# Patient Record
Sex: Female | Born: 1970 | Race: Black or African American | Hispanic: No | Marital: Married | Smoking: Never smoker
Health system: Southern US, Community
[De-identification: ages and names within clinical notes are randomized; demographics above are authoritative.]

## PROBLEM LIST (undated history)

## (undated) DIAGNOSIS — D509 Iron deficiency anemia, unspecified: Secondary | ICD-10-CM

---

## 2013-01-30 ENCOUNTER — Observation Stay: Payer: Self-pay | Admitting: Internal Medicine

## 2013-01-30 LAB — DRUG SCREEN, URINE
Amphetamines, Ur Screen: NEGATIVE (ref ?–1000)
Barbiturates, Ur Screen: NEGATIVE (ref ?–200)
Benzodiazepine, Ur Scrn: NEGATIVE (ref ?–200)
Cannabinoid 50 Ng, Ur ~~LOC~~: NEGATIVE (ref ?–50)
Cocaine Metabolite,Ur ~~LOC~~: NEGATIVE (ref ?–300)
Methadone, Ur Screen: NEGATIVE (ref ?–300)
Tricyclic, Ur Screen: NEGATIVE (ref ?–1000)

## 2013-01-30 LAB — COMPREHENSIVE METABOLIC PANEL
Alkaline Phosphatase: 62 U/L (ref 50–136)
Anion Gap: 7 (ref 7–16)
BUN: 12 mg/dL (ref 7–18)
Bilirubin,Total: 0.2 mg/dL (ref 0.2–1.0)
Calcium, Total: 9 mg/dL (ref 8.5–10.1)
Creatinine: 0.72 mg/dL (ref 0.60–1.30)
EGFR (African American): >60
EGFR (Non-African Amer.): >60
Osmolality: 277 (ref 275–301)
Potassium: 3.7 mmol/L (ref 3.5–5.1)
SGOT(AST): 23 U/L (ref 15–37)
SGPT (ALT): 28 U/L (ref 12–78)
Sodium: 139 mmol/L (ref 136–145)

## 2013-01-30 LAB — URINALYSIS, COMPLETE
Glucose,UR: NEGATIVE mg/dL (ref 0–75)
Ph: 6 (ref 4.5–8.0)
RBC,UR: <1 /HPF (ref 0–5)

## 2013-01-30 LAB — FERRITIN: Ferritin (ARMC): 2 ng/mL — ABNORMAL LOW (ref 8–388)

## 2013-01-30 LAB — TSH: Thyroid Stimulating Horm: 0.689 u[IU]/mL

## 2013-01-30 LAB — CBC
HCT: 24.4 % — ABNORMAL LOW (ref 35.0–47.0)
HGB: 7.1 g/dL — ABNORMAL LOW (ref 12.0–16.0)
MCHC: 29 g/dL — ABNORMAL LOW (ref 32.0–36.0)
MCV: 67 fL — ABNORMAL LOW (ref 80–100)
RBC: 3.68 10*6/uL — ABNORMAL LOW (ref 3.80–5.20)
RDW: 23.9 % — ABNORMAL HIGH (ref 11.5–14.5)
WBC: 7.2 10*3/uL (ref 3.6–11.0)

## 2013-01-30 LAB — RETICULOCYTES
Absolute Retic Count: 0.0839 10*6/uL
Reticulocyte: 2.27 %

## 2013-01-30 LAB — TROPONIN I: Troponin-I: <0.02 ng/mL

## 2013-01-31 LAB — BASIC METABOLIC PANEL
BUN: 10 mg/dL (ref 7–18)
Calcium, Total: 7.9 mg/dL — ABNORMAL LOW (ref 8.5–10.1)
Chloride: 113 mmol/L — ABNORMAL HIGH (ref 98–107)
Co2: 23 mmol/L (ref 21–32)
Creatinine: 0.73 mg/dL (ref 0.60–1.30)
EGFR (Non-African Amer.): >60
Glucose: 95 mg/dL (ref 65–99)
Sodium: 142 mmol/L (ref 136–145)

## 2013-01-31 LAB — CBC WITH DIFFERENTIAL/PLATELET
Basophil #: 0.1 10*3/uL (ref 0.0–0.1)
Basophil %: 1.5 %
Lymphocyte %: 25.5 %
MCH: 19.5 pg — ABNORMAL LOW (ref 26.0–34.0)
MCHC: 29.1 g/dL — ABNORMAL LOW (ref 32.0–36.0)
Monocyte #: 0.6 x10 3/mm (ref 0.2–0.9)
Neutrophil #: 3.6 10*3/uL (ref 1.4–6.5)
RBC: 3.24 10*6/uL — ABNORMAL LOW (ref 3.80–5.20)
WBC: 5.9 10*3/uL (ref 3.6–11.0)

## 2013-01-31 LAB — LIPID PANEL
Cholesterol: 102 mg/dL (ref 0–200)
HDL Cholesterol: 49 mg/dL (ref 40–60)

## 2013-01-31 LAB — MAGNESIUM: Magnesium: 1.8 mg/dL

## 2013-09-01 IMAGING — CT CT HEAD WITHOUT CONTRAST
1 series · 16 of 30 positions shown, 20 images · non-contrast
Comparison: none

REASON FOR EXAM: syncope, no memory
COMMENTS:   LMP: N/A

PROCEDURE:     CT  - CT HEAD WITHOUT CONTRAST  - January 30, 2013  [DATE]
RESULT:     Comparison:  None
TECHNIQUE: Multiple axial images from the foramen magnum to the vertex were
obtained without IV contrast.

[Series 2: soft tissue · axial · 0.40mm/px · z∈[-242,-102]mm · 16 of 32 slices shown, 20 images]
[im 2/32  brain]
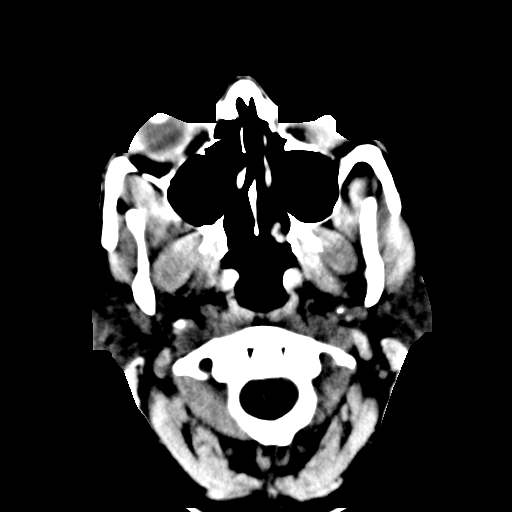
[im 2/32  bone]
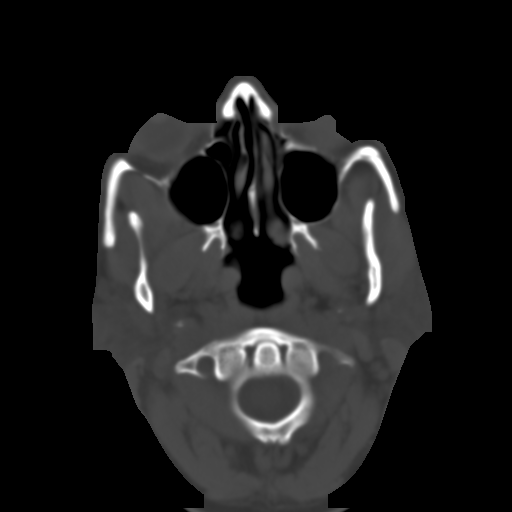
[im 4/32  brain]
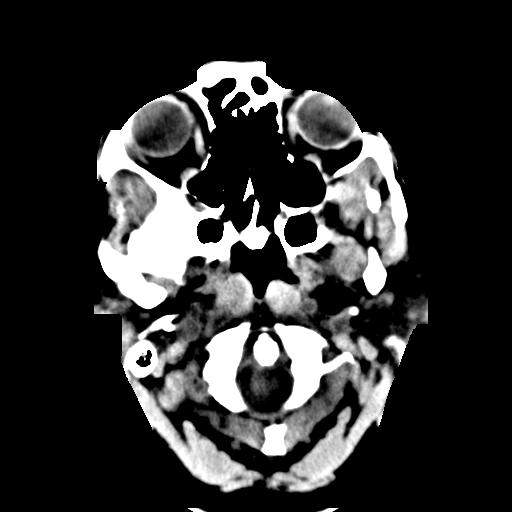
[im 6/32  brain]
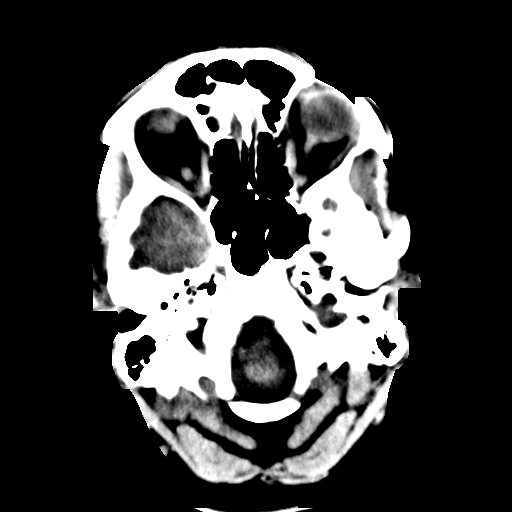
[im 8/32  brain]
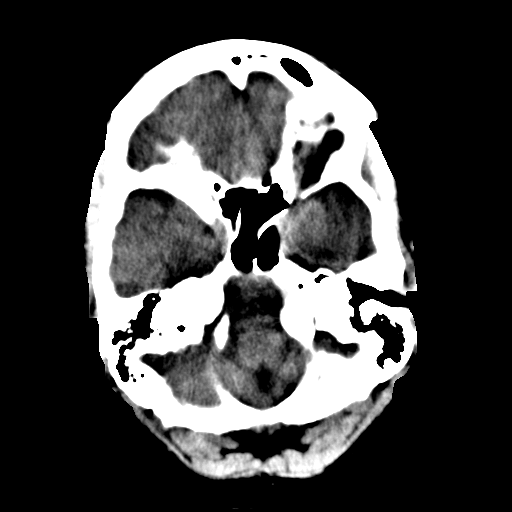
[im 9/32  brain]
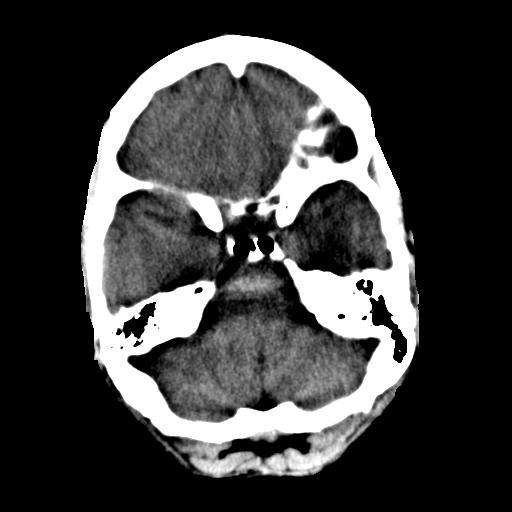
[im 9/32  bone]
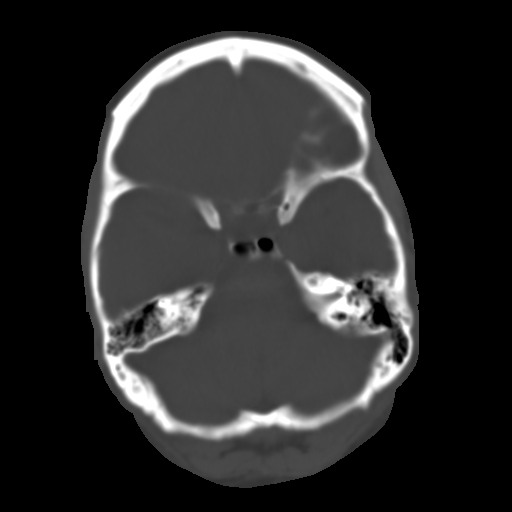
[im 11/32  brain]
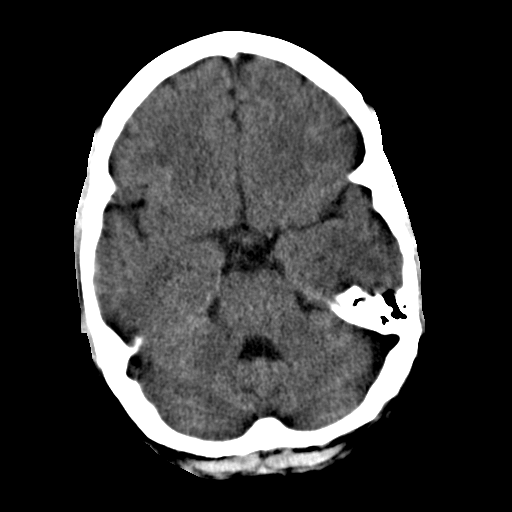
[im 13/32  brain]
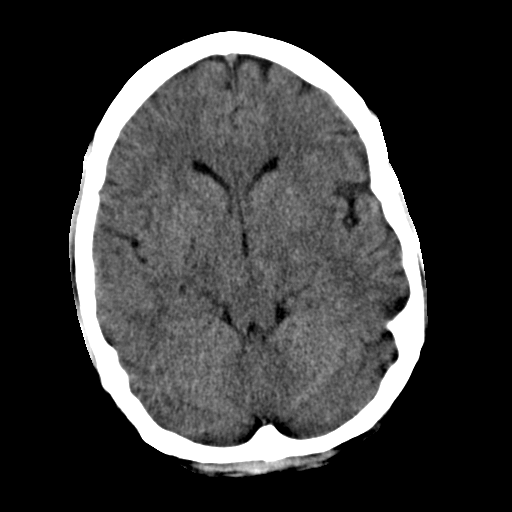
[im 15/32  brain]
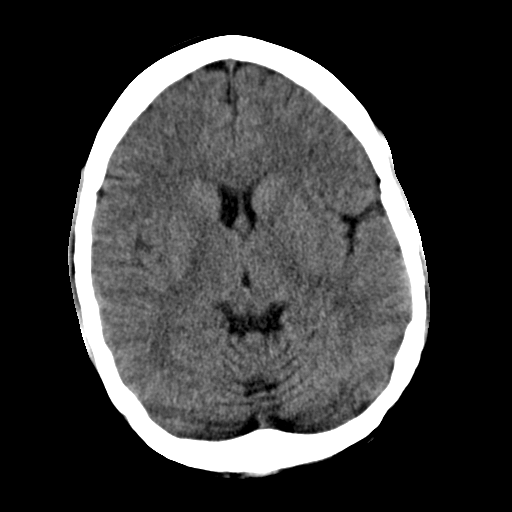
[im 17/32  brain]
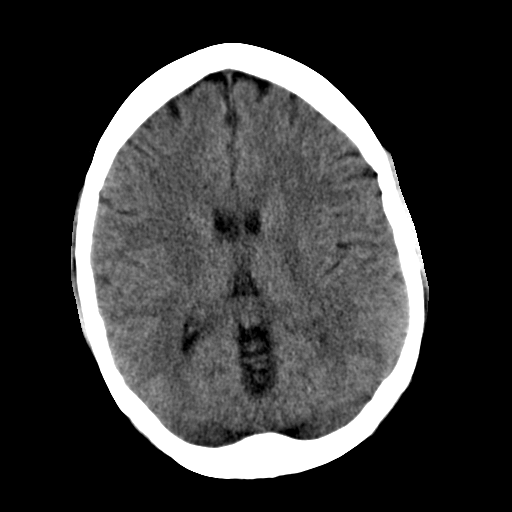
[im 17/32  bone]
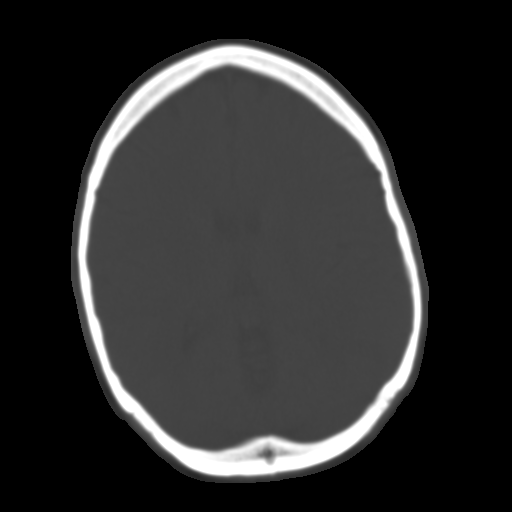
[im 19/32  brain]
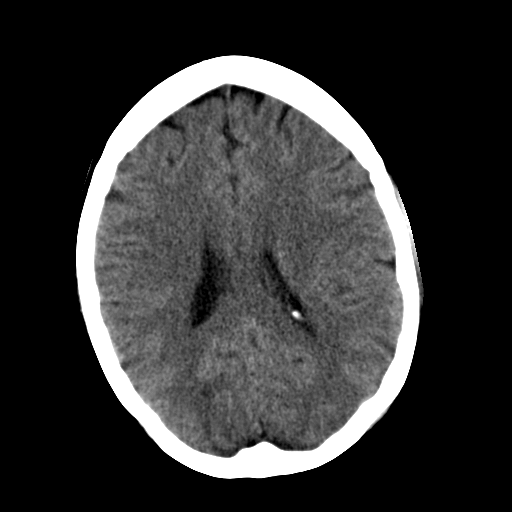
[im 21/32  brain]
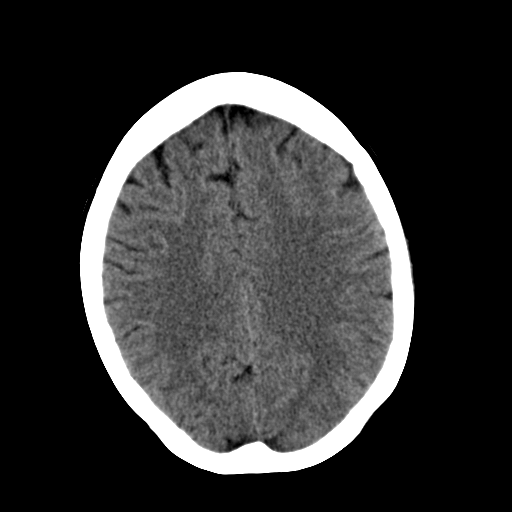
[im 23/32  brain]
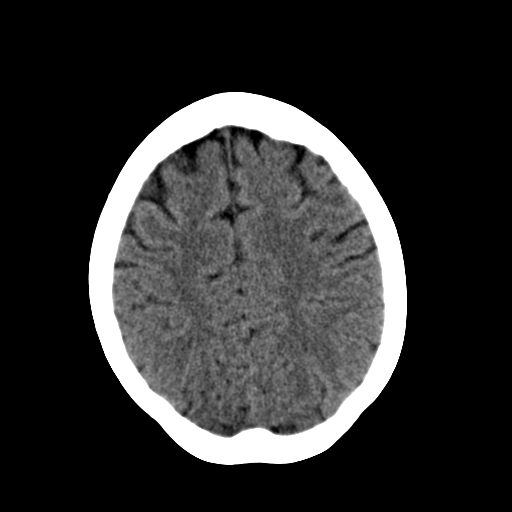
[im 24/32  brain]
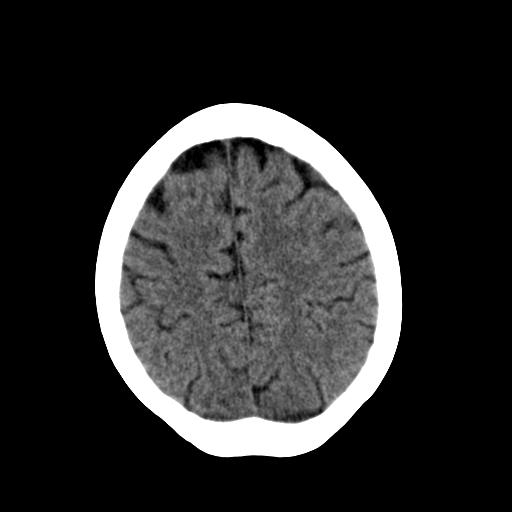
[im 24/32  bone]
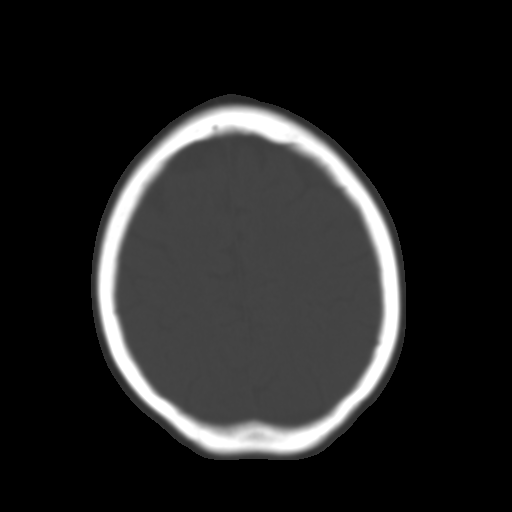
[im 26/32  brain]
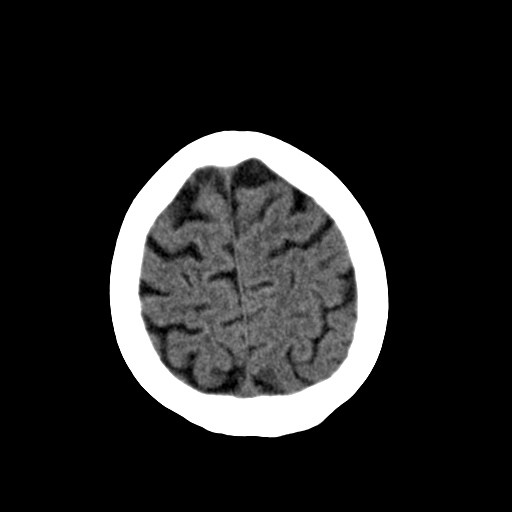
[im 28/32  brain]
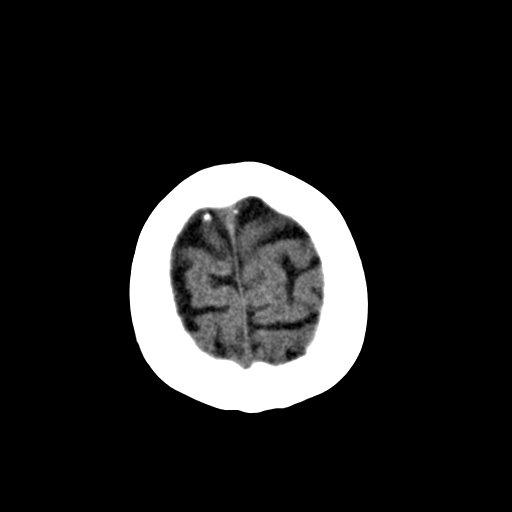
[im 30/32  brain]
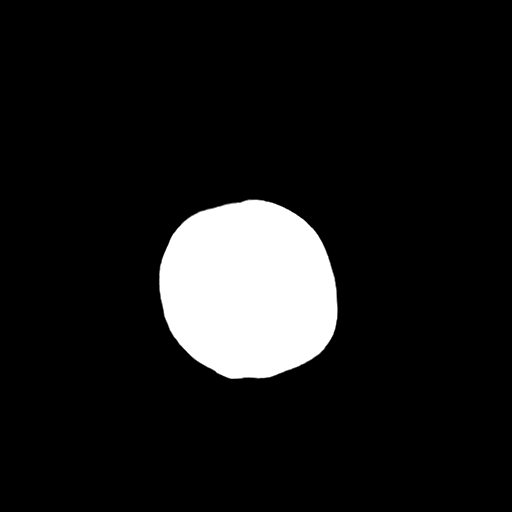

[16 of 30 positions shown; findings below may reference images not displayed]

FINDINGS: There is no evidence for mass effect, midline shift, or extra-axial fluid
collections. There is no evidence for space-occupying lesion, intracranial
hemorrhage, or cortical-based area of infarction.

The osseous structures are unremarkable.
IMPRESSION: No acute intracranial process.

[REDACTED]

## 2015-02-10 NOTE — Discharge Summary (Signed)
PATIENT NAME:  Ashley Valentine, [name] MR#:  [id number] DATE OF BIRTH:  June 24, 1971  DATE OF ADMISSION:  01/30/2013 DATE OF DISCHARGE:  01/31/2013  ADMITTING PHYSICIAN: Dr. Shaune PollackQing Chen.   DISCHARGING PHYSICIAN: Dr. Enid Baasadhika Mackensey Bolte.   PRIMARY CARE PHYSICIAN: None.   CONSULTATIONS IN THE HOSPITAL: None.   DISCHARGE DIAGNOSES: 1.  Acute-on-chronic anemia.  2.  Iron deficiency anemia.  3.  Syncope, secondary to above.   DISCHARGE HOME MEDICATIONS: 1.  Loratadine 10 mg p.o. as needed for allergies.  2.  Ferrous sulfate 325 mg, 1 tablet p.o. b.i.d. with meals.   DISCHARGE DIET: Regular diet.   DISCHARGE ACTIVITY: As tolerated.   FOLLOWUP INSTRUCTIONS: The patient is advised to follow up with Dr. Janese [facility]Sandeep [facility] in 1 to 2 weeks and get a repeat hemoglobin check.   Also advised to follow up with Dr. Mechele CollinElliott in 2 to 3 weeks for an outpatient EGD and colonoscopy.   LABS AND IMAGING STUDIES: WBC 5.9, hemoglobin 6.3, hematocrit 21.6, platelet count 334.   Sodium 142, potassium 3.6, chloride 113, bicarbonate 23, BUN 10, creatinine 0.73, glucose 95, calcium of 7.9, magnesium 1.8.   LDL 42, HDL 49, total cholesterol 045102, triglycerides 57.   Ferritin level is low at 2. Serum iron is low at 20. Iron binding capacity elevated at 446. TSH within normal limits at 0.689. Absolute retic count within normal limits. Urinalysis negative for infection. Urine tox screen is also negative.   CT of the head without contrast showing no acute intracranial process.   BRIEF HOSPITAL COURSE: The patient is a 44 year old female with no significant past medical history who has not had blood work done in the last 15 years, comes to the hospital after a syncopal episode while exerting herself. The patient has been feeling weak and fatigued easily lately and was found to have a hemoglobin of 7 on admission. She denies any hematemesis, melena or rectal bleeding. Denies any dysmenorrhea or menorrhagia. She  is still having her menstrual periods 5 days every month, which are considered to be normal, according to her.   No family history of colon cancer or anemias. The patient denies taking any NSAIDs or aspirin at home. After IV fluids her hemoglobin got diluted to 6.3. She has remained asymptomatic while in the hospital, not orthostatic, hypotensive or tachycardic. She refused blood transfusions when offered, so she is being given IV iron and being started on iron supplements twice a day at home at discharge based on her labs confirming iron deficiency.   The case has been discussed both with Dr. Sherrlyn Valentine and also Dr. Mechele CollinElliott. Stool for Hemoccult has been ordered, but the patient has not had a bowel movement here in the hospital. The patient was very insistent on going home. Since she is hemodynamically stable, follow ups were given with both Dr. Mechele CollinElliott for routine EGD and colonoscopy if needed if stool occult is positive, and with Dr. Sherrlyn Valentine to follow up on her anemia. Her course has been otherwise uneventful in the hospital. She was given 500 mg of IV iron sucrose over 4 hours prior to discharge.   DISCHARGE CONDITION: Stable.   DISCHARGE DISPOSITION: Home.   Time spent on discharge: Forty minutes.  ____________________________ Ashley Baasadhika Blayklee Mable, MD rk:dm D: 01/31/2013 11:28:00 ET T: 01/31/2013 13:02:46 ET JOB#: [id number]  cc: Ashley CapersSandeep R. Sherrlyn HockPandit, MD Ashley Junobert T. Elliott, MD Ashley Baasadhika Mahmoud Blazejewski, MD, <Dictator>    Ashley BaasADHIKA Ashley Bobak MD ELECTRONICALLY SIGNED 02/05/2013 15:53

## 2015-02-10 NOTE — H&P (Signed)
PATIENT NAME:  Ashley Valentine, Ashley Valentine MR#:  [id number] DATE OF BIRTH:  28-Dec-1970  DATE OF ADMISSION:  01/30/2013  PRIMARY CARE PHYSICIAN:  Dr. Vonita Moss.   REFERRING PHYSICIAN:  Dr. Lucrezia Europe.   CHIEF COMPLAINT:  Passed out today.   HISTORY OF PRESENT ILLNESS:  A 44 year old [place] American female with no past medical history presented in the ED with above chief complaint.  The patient is alert, awake, oriented, in no acute distress.  The patient said that patient feels dizzy and passed out for a few seconds in 4:00 p.m. in some shopping center.  The patient lost consciousness for a few seconds, but denies any injury.  The patient was sent to ED for further evaluation.  She was noted to have a low hemoglobin 7.1 and orthostatic hypotension.   PAST MEDICAL HISTORY:  No.   PAST SURGICAL HISTORY:  No.  SOCIAL HISTORY:  No smoking or drinking or illicit drugs.   FAMILY HISTORY:  Both the father and the mother had died of CHF.   ALLERGIES:  No.   MEDICATIONS:  No.  REVIEW OF SYSTEMS:  CONSTITUTIONAL:  The patient denies any fever, chills.  No headache, but has dizziness and mild weakness.  EYES:  No double vision or blurred vision.  EARS, NOSE, THROAT:  No postnasal drip, slurred speech or dysphagia.  CARDIOVASCULAR:  No chest pain, palpitation, orthopnea, or nocturnal dyspnea.  No leg edema.  GASTROINTESTINAL:  No abdominal pain, nausea, vomiting, or diarrhea.  No melena or bloody stool.  PULMONARY:  No cough, sputum, shortness of breath.  No hemoptysis.  GENITOURINARY:  No dysuria, hematuria, or incontinence.  SKIN:  No rash or jaundice.  HEMATOLOGY:  No easy bruising, bleeding.  ENDOCRINOLOGY:  No polyuria or polydipsia.  NEUROLOGY:  Positive for syncope, loss of consciousness, but no seizure.  PSYCHIATRIC:  No anxiety or depression.  OBSTETRICS AND GYNECOLOGY:  No heavy period.   PHYSICAL EXAMINATION: VITAL SIGNS:  Temperature 97.6, blood pressure 174/78, pulse 80,  respirations 18, O2 saturation 100% on room air.  GENERAL:  The patient is alert, awake, oriented, in no acute distress.  HEENT:  Pupils round, equal, reactive to light and accommodation.  NECK:  Supple.  No JVD or carotid bruits.  No lymphadenopathy.  No thyromegaly.  Moist oral mucosa.  Clear oropharynx.  CARDIOVASCULAR:  S1, S2, regular rate, rhythm.  No murmurs, gallops.  PULMONARY:  Bilateral air entry.  No wheezing or rales.  No use of accessory muscles to breathe.  ABDOMEN:  Soft.  No distention or tenderness.  No organomegaly.  Bowel sounds present.  EXTREMITIES:  No edema, clubbing, or cyanosis.  No calf tenderness.  Strong bilateral pedal pulses.  SKIN:  No rash or jaundice.  NEUROLOGIC:  Alert and oriented x 3.  No focal deficit.  Power 5 out of 5.  Sensation intact.   LABORATORY DATA:  Glucose 92, BUN 12, creatinine 0.72, sodium 139, potassium 3.7, chloride 108, bicarbonate 24.  Urine toxicology negative.  WBC 7.2, hemoglobin 7.1, hematocrit 24.4, MCV 67, platelets 370.  Urinalysis negative. Pregnancy test is negative.  Troponin less than 0.02.  A CAT scan of head, no acute intracranial abnormality.  EKG shows sinus rhythm with a first-degree AV block, possible left atrial enlargement, at 71 beats per minute.   IMPRESSION: 1.  Syncope, possibly due to anemia.  2.  Symptomatic anemia.  3.  Possible hypertension.   PLAN OF TREATMENT: 1.  The patient will be admitted to  medical floor.  We will continue telemonitor and start anemia work-up.  We will get a stool occult and give PRBC transfusion and follow-up CBC.  2.  We will get a GI consult to rule out any occult gastrointestinal bleeding.  3.  GI prophylaxis.  4.  DVT prophylaxis with Ted.  5.  Discussed the patient's condition and plan of treatment with the patient.   TIME SPENT:  About 52 minutes.     ____________________________ Shaune PollackQing Mubarak Bevens, MD qc:ea D: 01/30/2013 22:04:35 ET T: 01/30/2013 23:02:30  ET JOB#: [id number]  cc: Shaune PollackQing Floyde Dingley, MD, <Dictator> Shaune PollackQING Adison Reifsteck MD ELECTRONICALLY SIGNED 01/31/2013 17:45

## 2016-02-11 ENCOUNTER — Emergency Department: Payer: BLUE CROSS/BLUE SHIELD

## 2016-02-11 ENCOUNTER — Encounter: Payer: Self-pay | Admitting: Emergency Medicine

## 2016-02-11 ENCOUNTER — Emergency Department
Admission: EM | Admit: 2016-02-11 | Discharge: 2016-02-11 | Disposition: A | Discharge status: Home / Self Care | Payer: BLUE CROSS/BLUE SHIELD | Attending: Emergency Medicine | Admitting: Emergency Medicine

## 2016-02-11 DIAGNOSIS — R05 Cough: Secondary | ICD-10-CM | POA: Diagnosis present

## 2016-02-11 DIAGNOSIS — J209 Acute bronchitis, unspecified: Secondary | ICD-10-CM | POA: Diagnosis not present

## 2016-02-11 HISTORY — DX: Iron deficiency anemia, unspecified: D50.9

## 2016-02-11 MED ORDER — PREDNISONE 20 MG PO TABS: ORAL_TABLET | ORAL | Status: AC

## 2016-02-11 MED ORDER — AZITHROMYCIN 250 MG PO TABS: ORAL_TABLET | ORAL | Status: AC

## 2016-02-11 NOTE — ED Provider Notes (Signed)
[facility] Emergency Department Provider Note  ____________________________________________  Time seen: Approximately 2:48 PM  I have reviewed the triage vital signs and the nursing notes.   HISTORY  Chief Complaint Cough    HPI Ashley Valentine is a 45 y.o. female , NAD, presents emergency department with 1 month history of cough and chest congestion. Has had some sore throat over the course of her illness. Has been taking over-the-counter medications including cough drops, allergy medicines, cough syrups without any significant alleviation of symptoms. His last night she to coughing fit which caused her to lose sleep. Denies any chest pain, wheezing, shortness of breath, back pain. Has not had any fever, chills, body aches.   Past Medical History  Diagnosis Date  . Iron deficiency anemia     There are no active problems to display for this patient.   History reviewed. No pertinent past surgical history.  Current Outpatient Rx  Name  Route  Sig  Dispense  Refill  . azithromycin (ZITHROMAX Z-PAK) 250 MG tablet      Take 2 tablets (500 mg) on  Day 1,  followed by 1 tablet (250 mg) once daily on Days 2 through 5.   6 each   0   . predniSONE (DELTASONE) 20 MG tablet      Take 2 tablets by mouth, once daily, for 5 days   10 tablet   0     Allergies Review of patient's allergies indicates no known allergies.  History reviewed. No pertinent family history.  Social History Social History  Substance Use Topics  . Smoking status: Never Smoker   . Smokeless tobacco: None  . Alcohol Use: No     Review of Systems  Constitutional: No fever/chills, fatigue Eyes: No visual changes. No discharge, redness, swelling ENT: Positive sore throat. No nasal congestion, runny nose, ear pain, sinus pressure Cardiovascular: No chest pain. Respiratory: Positive chest congestion, cough. No shortness of breath. No wheezing.  Gastrointestinal: No abdominal  pain.  No nausea, vomiting.  Musculoskeletal: Negative for back pain.  Skin: Negative for rash, skin sores. Neurological: Negative for headaches, focal weakness or numbness. 10-point ROS otherwise negative.  ____________________________________________   PHYSICAL EXAM:  VITAL SIGNS: ED Triage Vitals  Enc Vitals Group     BP 02/11/16 1338 139/88 mmHg     Pulse Rate 02/11/16 1338 68     Resp 02/11/16 1338 18     Temp 02/11/16 1338 97.9 F (36.6 C)     Temp Source 02/11/16 1338 Oral     SpO2 02/11/16 1338 100 %     Weight 02/11/16 1338 170 lb (77.111 kg)     Height 02/11/16 1338 5\' 3"  (1.6 m)     Head Cir --      Peak Flow --      Pain Score 02/11/16 1340 0     Pain Loc --      Pain Edu? --      Excl. in GC? --      Constitutional: Alert and oriented. Well appearing and in no acute distress. Eyes: Conjunctivae are normal. PERRL. EOMI without pain.  Head: Atraumatic. ENT:      Ears: TMs visualized bilaterally without erythema, effusion, bulging, perforation      Nose: No congestion/rhinnorhea.      Mouth/Throat: Mucous membranes are moist. Pharynx with mild injection but no overt erythema. No swelling or exudates about the pharynx. Uvula is midline. Mild clear postnasal drip. Neck: Supple with  full range of motion. Hematological/Lymphatic/Immunilogical: No cervical lymphadenopathy. Cardiovascular: Normal rate, regular rhythm. Normal S1 and S2.  Good peripheral circulation. Respiratory: Normal respiratory effort without tachypnea or retractions. Lungs CTAB with breath sounds noted in all lung fields. Neurologic:  Normal speech and language. No gross focal neurologic deficits are appreciated.  Skin:  Skin is warm, dry and intact. No rash noted. Psychiatric: Mood and affect are normal. Speech and behavior are normal. Patient exhibits appropriate insight and judgement.   ____________________________________________    LABS  None ____________________________________________  EKG  None ____________________________________________  RADIOLOGY I have personally viewed and evaluated these images (plain radiographs) as part of my medical decision making, as well as reviewing the written report by the radiologist.  Dg Chest 2 View  02/11/2016  CLINICAL DATA:  Cough for 1 month.  Initial encounter. EXAM: CHEST  2 VIEW COMPARISON:  None. FINDINGS: The lungs are clear. Heart size is normal. No pneumothorax or pleural effusion. No bony abnormality. IMPRESSION: Negative chest. Electronically Signed   By: Drusilla Kanner M.D.   On: 02/11/2016 15:18    ____________________________________________    PROCEDURES  Procedure(s) performed: None    Medications - No data to display   ____________________________________________   INITIAL IMPRESSION / ASSESSMENT AND PLAN / ED COURSE  Pertinent imaging results that were available during my care of the patient were reviewed by me and considered in my medical decision making (see chart for details).  Patient's diagnosis is consistent with acute bronchitis. Patient will be discharged home with prescriptions for azithromycin and prednisone to take as directed. Patient is to follow up with [facility] if symptoms persist past this treatment course. Patient is given ED precautions to return to the ED for any worsening or new symptoms.      ____________________________________________  FINAL CLINICAL IMPRESSION(S) / ED DIAGNOSES  Final diagnoses:  Acute bronchitis, unspecified organism      NEW MEDICATIONS STARTED DURING THIS VISIT:  New Prescriptions   AZITHROMYCIN (ZITHROMAX Z-PAK) 250 MG TABLET    Take 2 tablets (500 mg) on  Day 1,  followed by 1 tablet (250 mg) once daily on Days 2 through 5.   PREDNISONE (DELTASONE) 20 MG TABLET    Take 2 tablets by mouth, once daily, for 5 days         Hope Pigeon, PA-C 02/11/16 1535  Myrna Blazer, MD 02/11/16 [phone]

## 2016-02-11 NOTE — Discharge Instructions (Signed)

## 2016-02-11 NOTE — ED Notes (Signed)
Pt states she has had a cough for the past month, coughing up phlegm.  Pt states she gets fatigued and feels like throat is inflammed. Throat is sore as well.  Pt states she is trying to medicate with OTC meds but is not helping.  Poor sleep at night.

## 2016-09-12 IMAGING — CR DG CHEST 2V
1 series · 2 of 2 positions shown · non-contrast
Comparison: None.

CLINICAL DATA: Cough for 1 month.  Initial encounter.

EXAM:
CHEST  2 VIEW

[Series 1: w chest pa · 0.14mm/px · 2 of 2 slices shown]
[im 1/2]
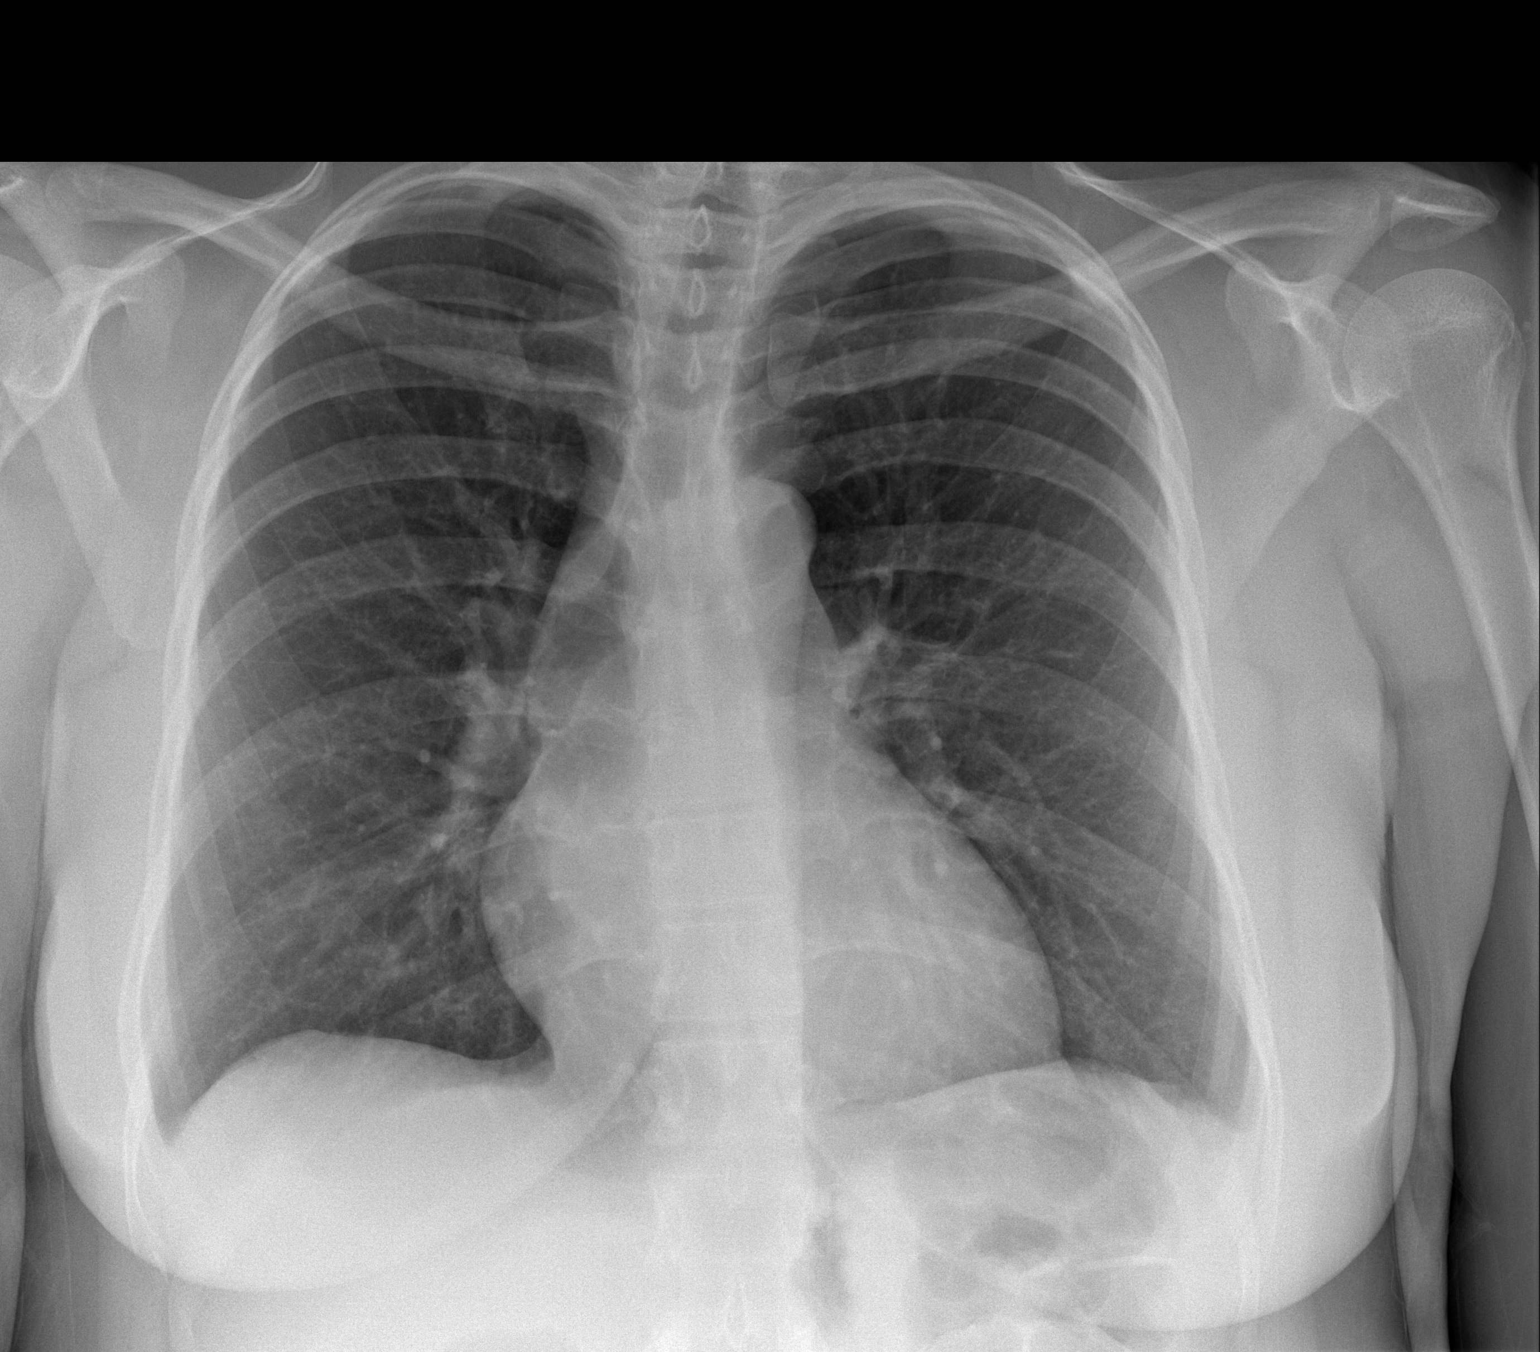
[im 2/2]
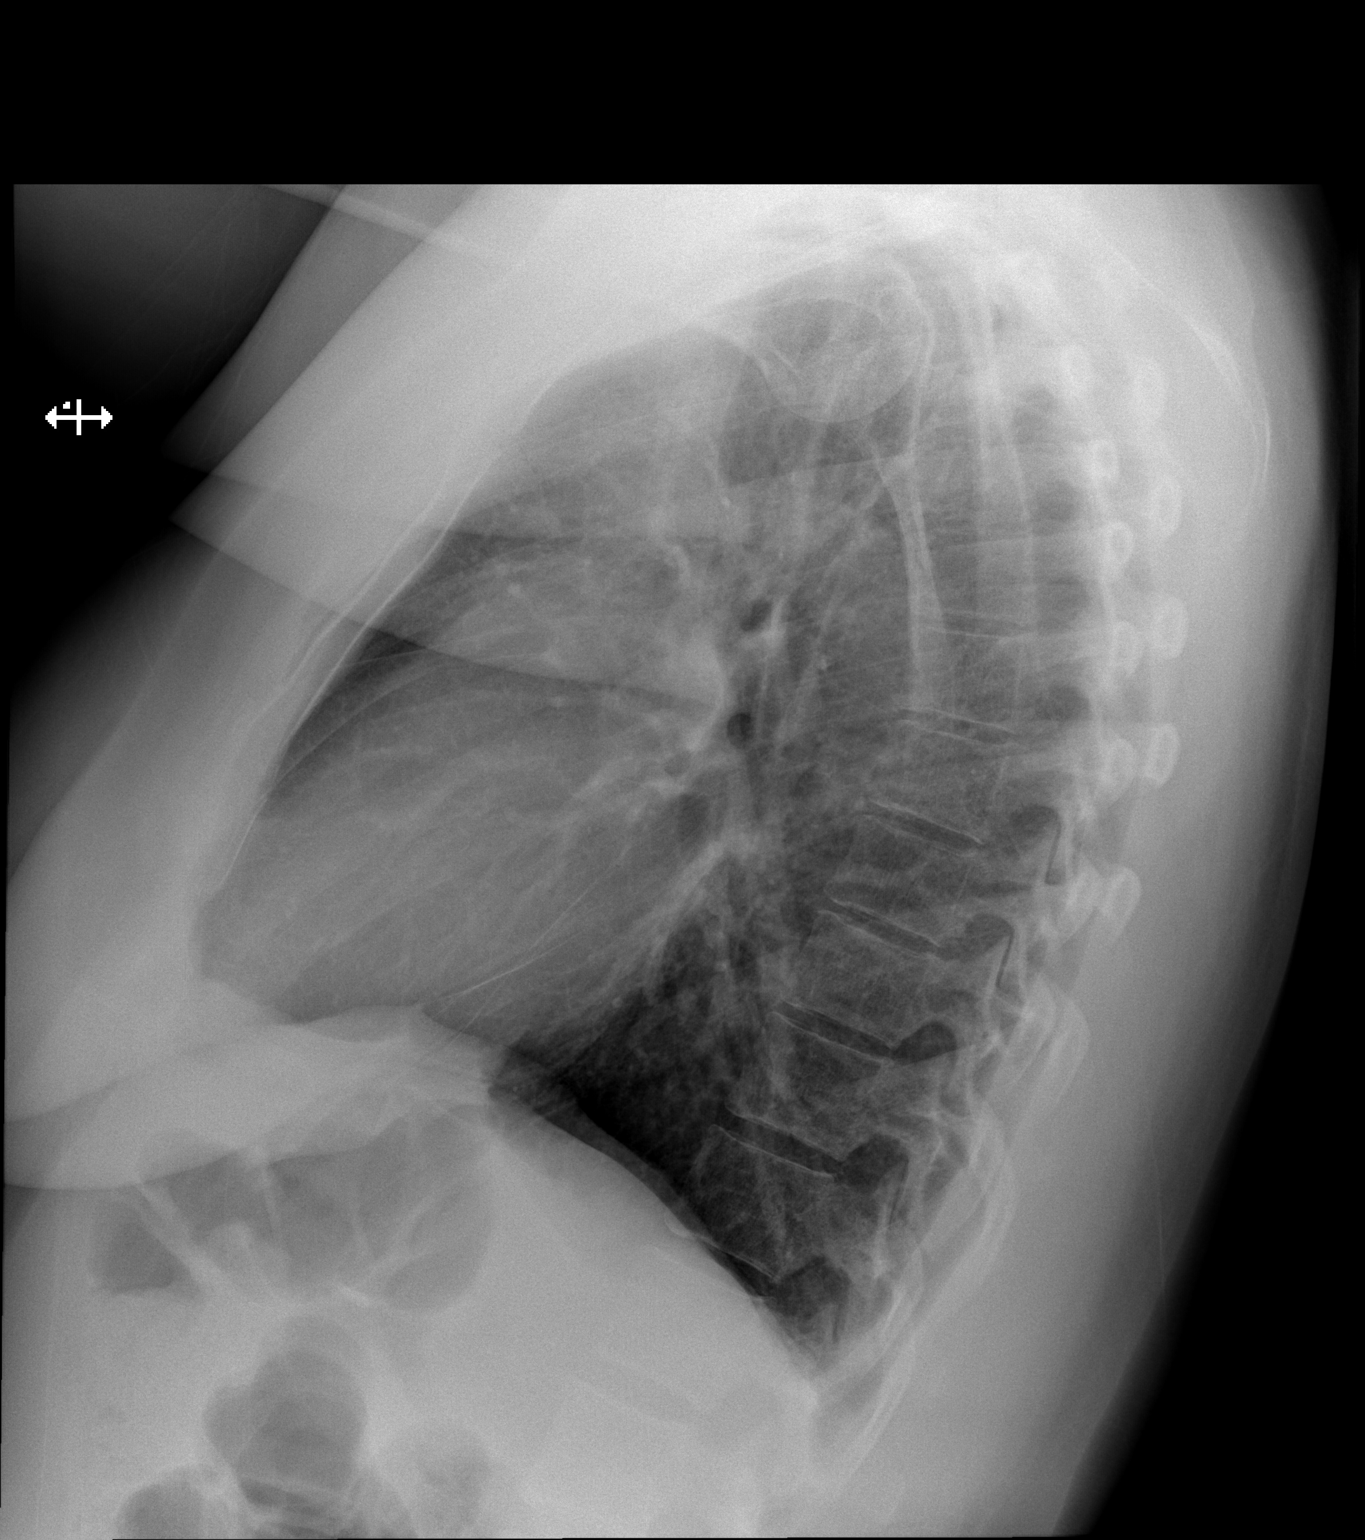

[2 of 2 positions shown; findings below may reference images not displayed]

FINDINGS: The lungs are clear. Heart size is normal. No pneumothorax or
pleural effusion. No bony abnormality.
IMPRESSION: Negative chest.
